# Patient Record
Sex: Female | Born: 1990 | Race: White | Hispanic: No | Smoking: Current every day smoker
Health system: Southern US, Community
[De-identification: ages and names within clinical notes are randomized; demographics above are authoritative.]

## PROBLEM LIST (undated history)

## (undated) HISTORY — PX: CHOLECYSTECTOMY: SHX55

## (undated) HISTORY — PX: DILATION AND CURETTAGE OF UTERUS: SHX78

## (undated) HISTORY — PX: OTHER SURGICAL HISTORY: SHX169

---

## 2016-11-20 ENCOUNTER — Emergency Department (HOSPITAL_COMMUNITY): Payer: Self-pay

## 2016-11-20 ENCOUNTER — Emergency Department (HOSPITAL_COMMUNITY)
Admission: EM | Admit: 2016-11-20 | Discharge: 2016-11-21 | Disposition: A | Discharge status: Home / Self Care | Payer: Self-pay | Attending: Emergency Medicine | Admitting: Emergency Medicine

## 2016-11-20 ENCOUNTER — Encounter (HOSPITAL_COMMUNITY): Payer: Self-pay | Admitting: Emergency Medicine

## 2016-11-20 DIAGNOSIS — R0789 Other chest pain: Secondary | ICD-10-CM | POA: Insufficient documentation

## 2016-11-20 DIAGNOSIS — R0602 Shortness of breath: Secondary | ICD-10-CM | POA: Insufficient documentation

## 2016-11-20 DIAGNOSIS — F17200 Nicotine dependence, unspecified, uncomplicated: Secondary | ICD-10-CM | POA: Insufficient documentation

## 2016-11-20 DIAGNOSIS — R05 Cough: Secondary | ICD-10-CM | POA: Insufficient documentation

## 2016-11-20 LAB — BASIC METABOLIC PANEL
Anion gap: 9 (ref 5–15)
BUN: 16 mg/dL (ref 6–20)
CHLORIDE: 102 mmol/L (ref 101–111)
CO2: 25 mmol/L (ref 22–32)
CREATININE: 0.93 mg/dL (ref 0.44–1.00)
Calcium: 9.5 mg/dL (ref 8.9–10.3)
GFR calc non Af Amer: >60 mL/min (ref >60)
Glucose, Bld: 95 mg/dL (ref 65–99)
POTASSIUM: 3.7 mmol/L (ref 3.5–5.1)
Sodium: 136 mmol/L (ref 135–145)

## 2016-11-20 LAB — CBC
HEMATOCRIT: 45.2 % (ref 36.0–46.0)
Hemoglobin: 15.5 g/dL — ABNORMAL HIGH (ref 12.0–15.0)
MCH: 33.2 pg (ref 26.0–34.0)
MCHC: 34.3 g/dL (ref 30.0–36.0)
MCV: 96.8 fL (ref 78.0–100.0)
PLATELETS: 227 10*3/uL (ref 150–400)
RBC: 4.67 MIL/uL (ref 3.87–5.11)
RDW: 13 % (ref 11.5–15.5)
WBC: 15.7 10*3/uL — AB (ref 4.0–10.5)

## 2016-11-20 LAB — I-STAT TROPONIN, ED: Troponin i, poc: 0 ng/mL (ref 0.00–0.08)

## 2016-11-20 MED ORDER — DIAZEPAM 5 MG PO TABS
5.0000 mg | ORAL_TABLET | Freq: Once | ORAL | Status: AC
  Administered 2016-11-21: 5 mg via ORAL
  Filled 2016-11-20: qty 1

## 2016-11-20 MED ORDER — OXYCODONE HCL 5 MG PO TABS
5.0000 mg | ORAL_TABLET | Freq: Once | ORAL | Status: AC
  Administered 2016-11-21: 5 mg via ORAL
  Filled 2016-11-20: qty 1

## 2016-11-20 MED ORDER — ACETAMINOPHEN 500 MG PO TABS
1000.0000 mg | ORAL_TABLET | Freq: Once | ORAL | Status: AC
  Administered 2016-11-21: 1000 mg via ORAL
  Filled 2016-11-20: qty 2

## 2016-11-20 MED ORDER — KETOROLAC TROMETHAMINE 60 MG/2ML IM SOLN
15.0000 mg | Freq: Once | INTRAMUSCULAR | Status: AC
  Administered 2016-11-21: 15 mg via INTRAMUSCULAR
  Filled 2016-11-20: qty 2

## 2016-11-20 NOTE — ED Notes (Signed)
Provider bedside.

## 2016-11-20 NOTE — Discharge Instructions (Signed)
Take 4 over the counter ibuprofen tablets 3 times a day or 2 over-the-counter naproxen tablets twice a day for pain. Also take tylenol 1000mg (2 extra strength) four times a day.    Stop smoking!

## 2016-11-20 NOTE — ED Triage Notes (Signed)
Pt presents to with CP and SOB that began when she was washing dishes; pt states the pain is central chest and radiates to R shoulder and arm; pt denies lightheadedness, n/v, or syncope; pt denies hx of same

## 2016-11-20 NOTE — ED Notes (Signed)
Pt remains in triage waiting. Updated on wait for treatment room. 

## 2016-11-21 NOTE — ED Provider Notes (Signed)
[facility] EMERGENCY DEPARTMENT Provider Note   CSN: [id number] Arrival date & time: 11/20/16  1938     History   Chief Complaint Chief Complaint  Patient presents with  . Chest Pain  . Shortness of Breath    HPI Kim Humphrey is a 26 y.o. female.  26 yo F with a chief complaint of right-sided chest pain.  This is worse with movement palpation or twisting.  Going on for about 6 hours.  The patient has had a cough for the past week.  She denies trauma, denies fevers.  She denies prior PE or DVT.  Denies hemoptysis denies recent surgery denies prolonged travel.  She had some transient lower extremity edema was bilateral and resolved.  She is a smoker.  Denies hypertension hyperlipidemia diabetes or family history of MI.  She denies estrogen use.   The history is provided by the patient.  Chest Pain   This is a new problem. The current episode started 6 to 12 hours ago. The problem occurs constantly. The problem has not changed since onset.The pain is associated with movement, coughing and raising an arm. The pain is present in the lateral region. The pain is at a severity of 9/10. The pain is moderate. The quality of the pain is described as brief, sharp and stabbing. The pain radiates to the right arm. Duration of episode(s) is 6 hours. The symptoms are aggravated by certain positions. Associated symptoms include cough and shortness of breath. Pertinent negatives include no dizziness, no fever, no headaches, no nausea, no palpitations and no vomiting. She has tried nothing for the symptoms. The treatment provided no relief. Risk factors include smoking/tobacco exposure.  Pertinent negatives for past medical history include no diabetes, no DVT, no hyperlipidemia, no hypertension, no MI and no PE.  Pertinent negatives for family medical history include: no early MI.  Shortness of Breath  Associated symptoms include cough, chest pain and leg swelling (transient  bilateral,  resolved ). Pertinent negatives include no fever, no headaches, no rhinorrhea, no wheezing and no vomiting. Associated medical issues do not include PE, past MI or DVT.    History reviewed. No pertinent past medical history.  There are no active problems to display for this patient.   Past Surgical History:  Procedure Laterality Date  . CHOLECYSTECTOMY    . DILATION AND CURETTAGE OF UTERUS    . leap      OB History    No data available       Home Medications    Prior to Admission medications   Not on File    Family History History reviewed. No pertinent family history.  Social History Social History  Substance Use Topics  . Smoking status: Current Every Day Smoker  . Smokeless tobacco: Not on file  . Alcohol use Yes     Comment: social     Allergies   Ceclor [cefaclor]   Review of Systems Review of Systems  Constitutional: Negative for chills and fever.  HENT: Negative for congestion and rhinorrhea.   Eyes: Negative for redness and visual disturbance.  Respiratory: Positive for cough and shortness of breath. Negative for wheezing.   Cardiovascular: Positive for chest pain and leg swelling (transient bilateral,  resolved ). Negative for palpitations.  Gastrointestinal: Negative for nausea and vomiting.  Genitourinary: Negative for dysuria and urgency.  Musculoskeletal: Negative for arthralgias and myalgias.  Skin: Negative for pallor and wound.  Neurological: Negative for dizziness and headaches.  Physical Exam Updated Vital Signs BP 106/78   Pulse 63   Temp 98.6 F (37 C) (Oral)   Resp 17   Ht 5\' 5"  (1.651 m)   Wt 64.4 kg (142 lb)   LMP 10/29/2016   SpO2 99%   BMI 23.63 kg/m   Physical Exam  Constitutional: She is oriented to person, place, and time. She appears well-developed and well-nourished. No distress.  HENT:  Head: Normocephalic and atraumatic.  Eyes: Pupils are equal, round, and reactive to light. EOM are normal.    Neck: Normal range of motion. Neck supple.  Cardiovascular: Normal rate and regular rhythm.  Exam reveals no gallop and no friction rub.   No murmur heard. Pulmonary/Chest: Effort normal. She has no wheezes. She has no rales. She exhibits tenderness (right sided reproduces pain).  Abdominal: Soft. She exhibits no distension. There is no tenderness.  Musculoskeletal: She exhibits no edema or tenderness.  Neurological: She is alert and oriented to person, place, and time.  Skin: Skin is warm and dry. She is not diaphoretic.  Psychiatric: She has a normal mood and affect. Her behavior is normal.  Nursing note and vitals reviewed.    ED Treatments / Results  Labs (all labs ordered are listed, but only abnormal results are displayed) Labs Reviewed  CBC - Abnormal; Notable for the following:       Result Value   WBC 15.7 (*)    Hemoglobin 15.5 (*)    All other components within normal limits  BASIC METABOLIC PANEL  I-STAT TROPONIN, ED    EKG  EKG Interpretation  Date/Time:  Friday November 20 2016 19:39:24 EDT Ventricular Rate:  82 PR Interval:  116 QRS Duration: 94 QT Interval:  372 QTC Calculation: 434 R Axis:   99 Text Interpretation:  Normal sinus rhythm with sinus arrhythmia Rightward axis Incomplete right bundle branch block Borderline ECG No old tracing to compare Confirmed by Melene Plan [phone]) on 11/20/2016 11:08:35 PM       Radiology Dg Chest 2 View  Result Date: 11/20/2016 CLINICAL DATA:  Right-sided chest pain and shortness of breath beginning this afternoon. EXAM: CHEST  2 VIEW COMPARISON:  None. FINDINGS: The heart size and mediastinal contours are within normal limits. Both lungs are clear. The visualized skeletal structures are unremarkable. IMPRESSION: No active cardiopulmonary disease. Electronically Signed   By: Elberta Fortis M.D.   On: 11/20/2016 20:45    Procedures Procedures (including critical care time)  Medications Ordered in ED Medications   ketorolac (TORADOL) injection 15 mg (not administered)  acetaminophen (TYLENOL) tablet 1,000 mg (not administered)  oxyCODONE (Oxy IR/ROXICODONE) immediate release tablet 5 mg (not administered)  diazepam (VALIUM) tablet 5 mg (not administered)     Initial Impression / Assessment and Plan / ED Course  I have reviewed the triage vital signs and the nursing notes.  Pertinent labs & imaging results that were available during my care of the patient were reviewed by me and considered in my medical decision making (see chart for details).     26 yo F with a chief complaint of chest pain.  Reproduced with palpation of the chest wall.  Likely muscular skeletal.  Patient had workup done in triage negative troponin mild elevation of her white count chest x-ray unremarkable.  PERC negative. Will treat as muscular skeletal chest pain.  Discharge home.  Discussed smoking cessation with patient and was they were offerred resources to help stop.  Total time was 5 min CPT code  [id number].   12:12 AM:  I have discussed the diagnosis/risks/treatment options with the patient and family and believe the pt to be eligible for discharge home to follow-up with PCP. We also discussed returning to the ED immediately if new or worsening sx occur. We discussed the sx which are most concerning (e.g., sudden worsening pain, fever, inability to tolerate by mouth) that necessitate immediate return. Medications administered to the patient during their visit and any new prescriptions provided to the patient are listed below.  Medications given during this visit Medications  ketorolac (TORADOL) injection 15 mg (not administered)  acetaminophen (TYLENOL) tablet 1,000 mg (not administered)  oxyCODONE (Oxy IR/ROXICODONE) immediate release tablet 5 mg (not administered)  diazepam (VALIUM) tablet 5 mg (not administered)     The patient appears reasonably screen and/or stabilized for discharge and I doubt any other medical  condition or other [facility] requiring further screening, evaluation, or treatment in the ED at this time prior to discharge.     Final Clinical Impressions(s) / ED Diagnoses   Final diagnoses:  Chest wall pain    New Prescriptions New Prescriptions   No medications on file     Melene PlanFloyd, Tayvin Preslar, DO 11/21/16 0012

## 2019-01-23 IMAGING — DX DG CHEST 2V
2 series · 2 of 2 positions shown · non-contrast
Comparison: None.

CLINICAL DATA: Right-sided chest pain and shortness of breath
beginning this afternoon.

EXAM:
CHEST  2 VIEW

[chest pa]
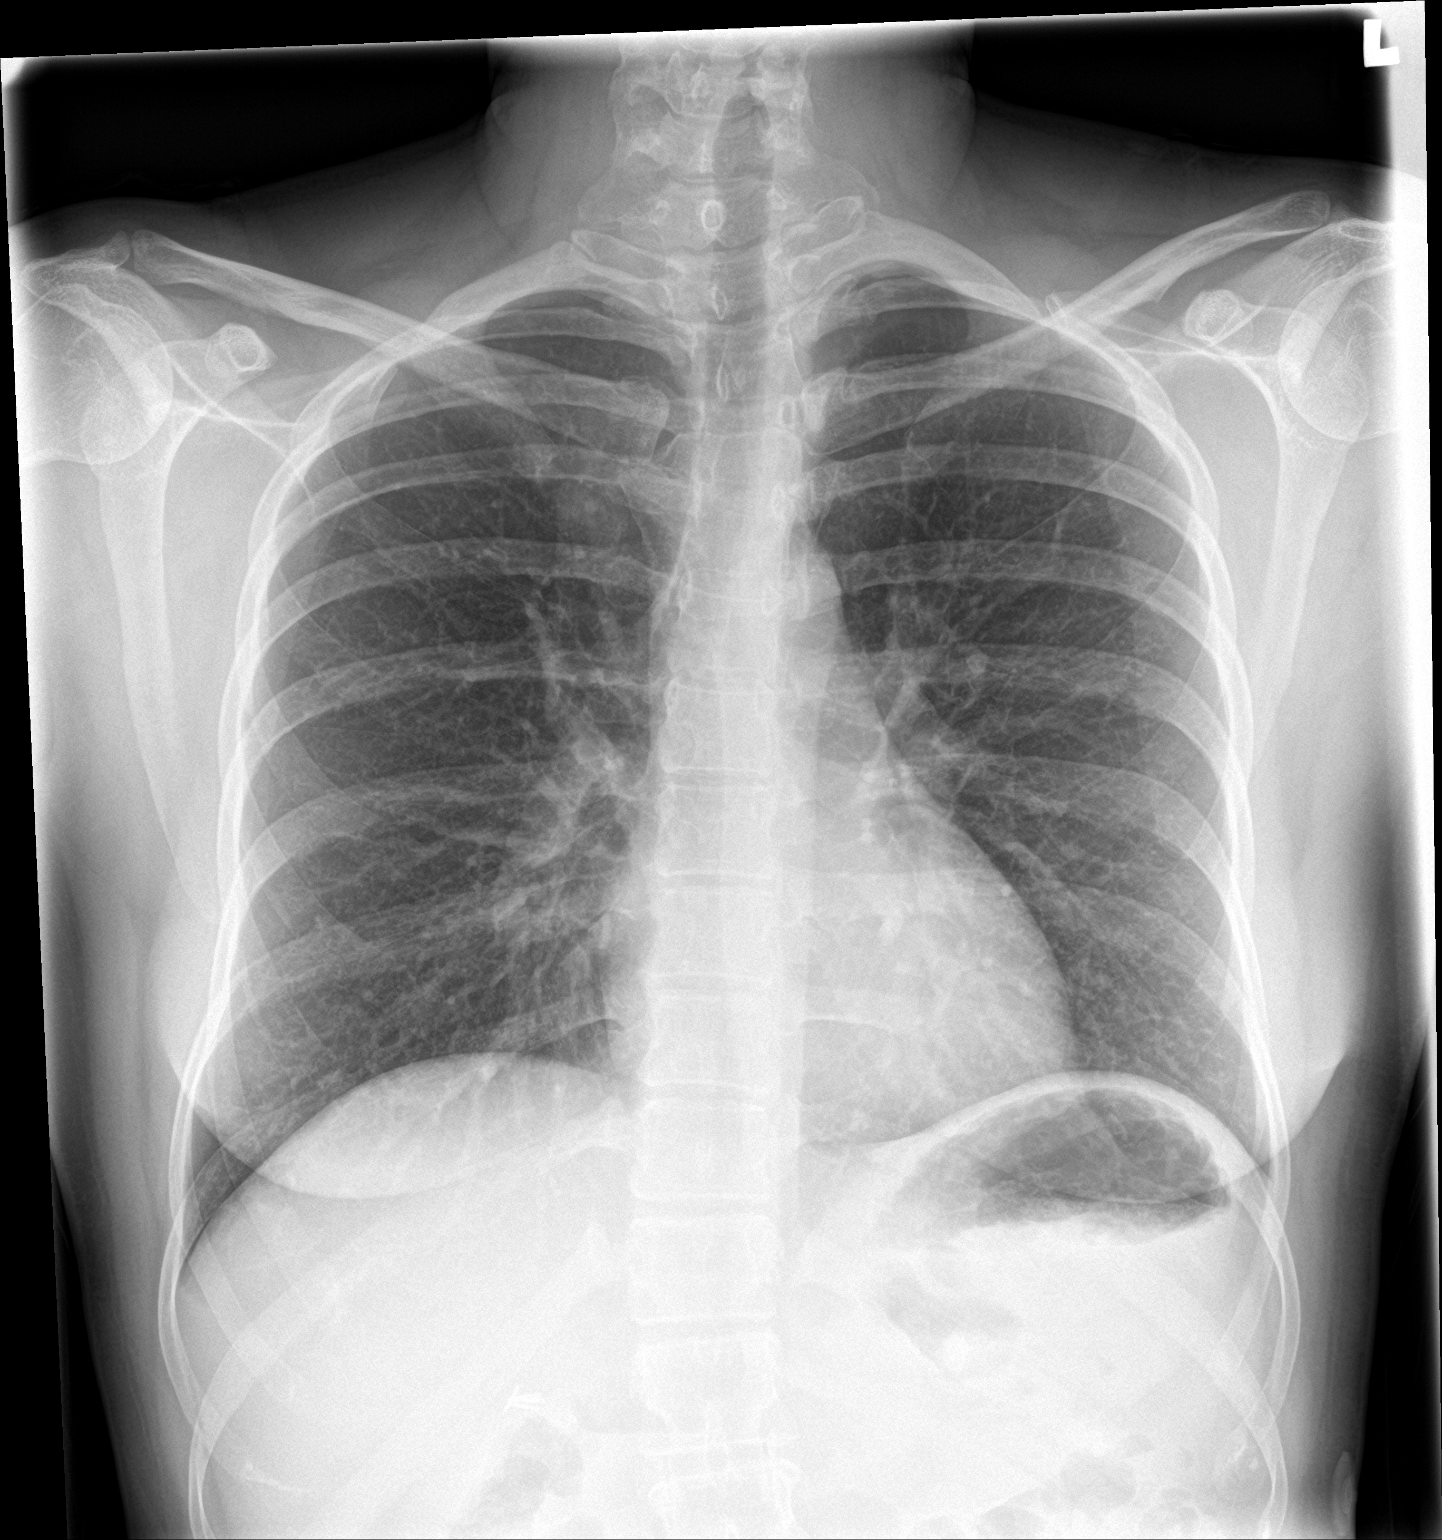

[chest lat]
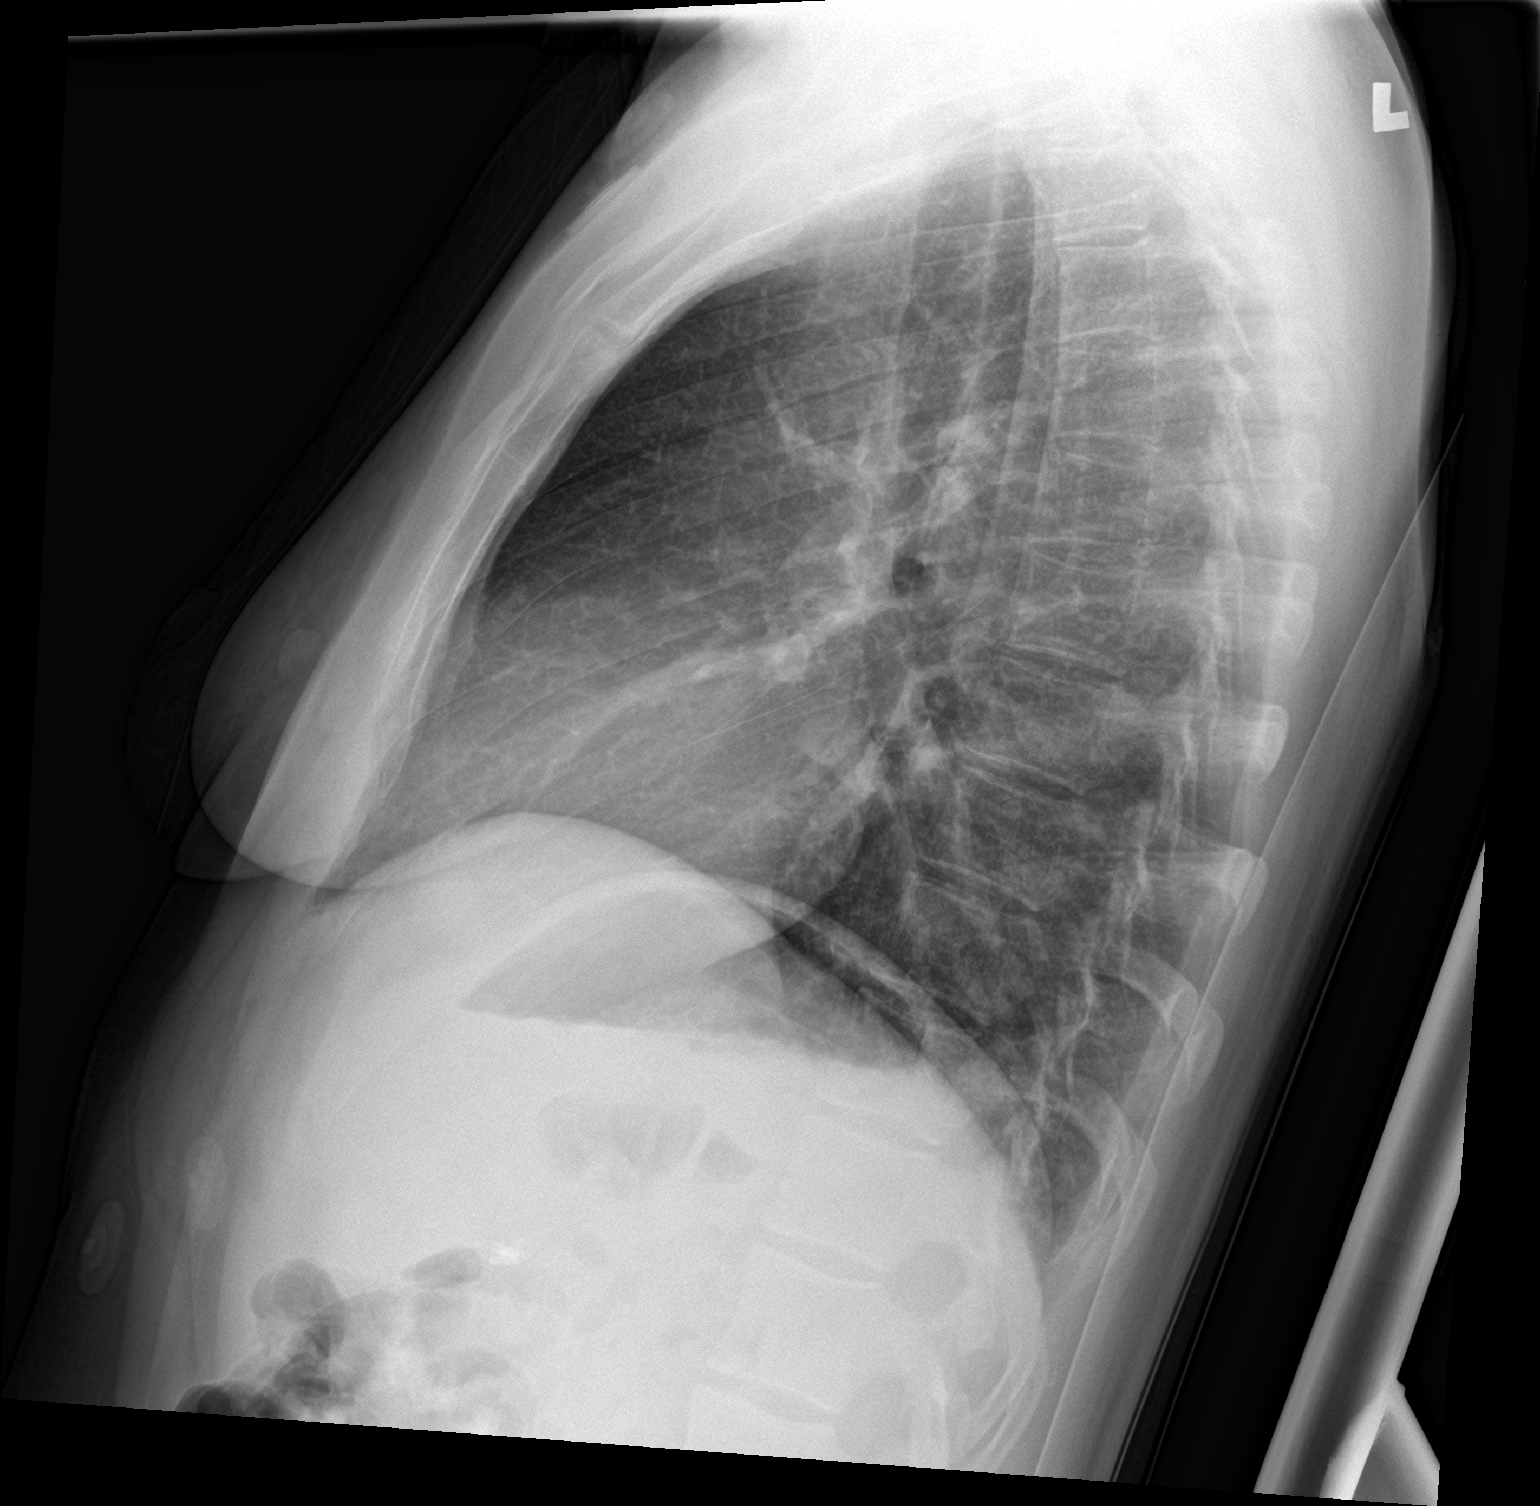

[2 of 2 positions shown; findings below may reference images not displayed]

FINDINGS: The heart size and mediastinal contours are within normal limits.
Both lungs are clear. The visualized skeletal structures are
unremarkable.
IMPRESSION: No active cardiopulmonary disease.
